# Patient Record
Sex: Male | Born: 1995 | Race: White | Hispanic: No | Marital: Single | Smoking: Never smoker
Health system: Southern US, Community
[De-identification: ages and names within clinical notes are randomized; demographics above are authoritative.]

## PROBLEM LIST (undated history)

## (undated) DIAGNOSIS — Q676 Pectus excavatum: Secondary | ICD-10-CM

## (undated) DIAGNOSIS — G43909 Migraine, unspecified, not intractable, without status migrainosus: Secondary | ICD-10-CM

## (undated) DIAGNOSIS — A4902 Methicillin resistant Staphylococcus aureus infection, unspecified site: Secondary | ICD-10-CM

## (undated) HISTORY — DX: Migraine, unspecified, not intractable, without status migrainosus: G43.909

## (undated) HISTORY — DX: Methicillin resistant Staphylococcus aureus infection, unspecified site: A49.02

## (undated) HISTORY — DX: Pectus excavatum: Q67.6

---
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
## 2020-05-27 ENCOUNTER — Emergency Department (HOSPITAL_COMMUNITY)
Admission: EM | Admit: 2020-05-27 | Discharge: 2020-05-27 | Disposition: A | Discharge status: Home / Self Care | Payer: Self-pay | Attending: Emergency Medicine | Admitting: Emergency Medicine

## 2020-05-27 ENCOUNTER — Other Ambulatory Visit: Payer: Self-pay

## 2020-05-27 DIAGNOSIS — Z8739 Personal history of other diseases of the musculoskeletal system and connective tissue: Secondary | ICD-10-CM | POA: Insufficient documentation

## 2020-05-27 DIAGNOSIS — Z5321 Procedure and treatment not carried out due to patient leaving prior to being seen by health care provider: Secondary | ICD-10-CM | POA: Insufficient documentation

## 2020-05-27 DIAGNOSIS — M25562 Pain in left knee: Secondary | ICD-10-CM | POA: Insufficient documentation

## 2020-05-27 NOTE — ED Notes (Signed)
NT from triage advised that patient has left the building.

## 2020-05-27 NOTE — ED Triage Notes (Signed)
Pt reports his L knee is "constantly popping out of place." Also has two herniated discs in his back which are causing ongoing pain down legs, in back, and into arms. Pt requesting prescription for "opiod pain medicine" because he tried contact the pain clinic and they didn't answer the phone.

## 2020-05-27 NOTE — ED Notes (Signed)
Patient reports herniated discs in back for last 5-7 years. Over the last 90 days, patient left knee cap has been popping out of place. Patient reports 10/10 pain in back and knee pain is "just annoying." Has seen chiropractor for back with no relief in pain. 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 Patient also reports that he has a CD of imaging form 11/2018 if needed.

## 2021-08-23 ENCOUNTER — Other Ambulatory Visit: Payer: Self-pay

## 2021-08-23 ENCOUNTER — Encounter (HOSPITAL_COMMUNITY): Payer: Self-pay | Admitting: Emergency Medicine

## 2021-08-23 ENCOUNTER — Emergency Department (HOSPITAL_COMMUNITY)
Admission: EM | Admit: 2021-08-23 | Discharge: 2021-08-23 | Discharge status: Against Medical Advice | Payer: Self-pay | Attending: Emergency Medicine | Admitting: Emergency Medicine

## 2021-08-23 ENCOUNTER — Emergency Department (HOSPITAL_COMMUNITY): Payer: Self-pay

## 2021-08-23 DIAGNOSIS — R200 Anesthesia of skin: Secondary | ICD-10-CM | POA: Insufficient documentation

## 2021-08-23 DIAGNOSIS — M79641 Pain in right hand: Secondary | ICD-10-CM | POA: Insufficient documentation

## 2021-08-23 DIAGNOSIS — Z5321 Procedure and treatment not carried out due to patient leaving prior to being seen by health care provider: Secondary | ICD-10-CM | POA: Insufficient documentation
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
## 2022-10-27 ENCOUNTER — Ambulatory Visit (HOSPITAL_COMMUNITY)
Admission: EM | Admit: 2022-10-27 | Discharge: 2022-10-27 | Disposition: A | Discharge status: Home / Self Care | Payer: 59

## 2022-10-27 DIAGNOSIS — F1910 Other psychoactive substance abuse, uncomplicated: Secondary | ICD-10-CM | POA: Diagnosis not present

## 2022-10-27 NOTE — ED Provider Notes (Signed)
Behavioral Health Urgent Care Medical Screening Exam  Patient Name: Zachary Johns MRN: [id number] Date of Evaluation: 10/27/22 Chief Complaint:   Diagnosis:  Final diagnoses:  Polysubstance abuse (HCC)    History of Present illness: Zachary Johns is a 27 y.o. male. Patient presents to [facility] voluntarily accompanied by his father. Patient reports that he is trying to get into a long term treatment program for his addiction. 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 He recently completed a 7 day program at [facility] in [place]. However, his dad encouraged him to get into at least a 28 day program.  He reports that he has not use any substances since discharge from [facility]. Patient reports that he has been struggling with substance use for about 10 years. His father advised that in order to prepare a long term sobriety, patient needs to get into a long term treatment program.  Patient denies SI/HI/AVH.  Reports not taking any medications. Reports no additional diagnoses.  Patient denies hx of abuse or neglect. He reports that his family is supportive. He currently lives with his grandmother and his parents  are supportive.   Assessment: 81 year old who is cooperative and pleasant upon approach. He is alert and oriented. He appears healthy an well nourished. His thought process is organized and goal-directed. Does not appear to be preoccupied or responding to internal stimuli. He has good eye contact an remains focused throughout this assessment. Patient denies SI/HI/AVH. Denies hx of self-harm behaviors. Admits to being addicted to Meth and alcohol. Last use was prior to going to [facility] 7 days ago. Patient reports that treatment at [facility] was not enough and he is looking for another service.  He reports family hx of alcohol abuse. Reports that he is supported by his family.  Patient lives with his grandmother. He does not have any children.   Patient denies medical concerns. He  denies withdrawal symptoms. Denies nausea/vomiting. Denies headache/dizziness.  Denies  tremors. Denies hx of seizures. Denies chest/back/abdominal pain. Denies respiratory problems. He reports that his goal is to get into a long term program for  long term treatment.   Patient is given resources for substance abuse treatment. He expresses motivation and states he is going to contact them. He has no sign of distress upon discharge.   Flowsheet Row ED from 10/27/2022 in [facility] ED from 08/23/2021 in [facility] Emergency Department at [facility] ED from 05/27/2020 in [facility] Department at [facility]  C-SSRS RISK CATEGORY No Risk No Risk No Risk       Psychiatric Specialty Exam  Presentation  General Appearance:Casual  Eye Contact:Fair  Speech:Clear and Coherent  Speech Volume:Normal  Handedness:Right   Mood and Affect  Mood: Euthymic  Affect: Appropriate   Thought Process  Thought Processes: Coherent  Descriptions of Associations:Intact  Orientation:Full (Time, Place and Person)  Thought Content:Logical    Hallucinations:None  Ideas of Reference:None  Suicidal Thoughts:No  Homicidal Thoughts:No   Sensorium  Memory: Immediate Fair; Recent Fair; Remote Fair  Judgment: Fair  Insight: Fair   Art therapist  Concentration: Fair  Attention Span: Fair  Recall: [employer] of Knowledge:No data recorded Language: Fair   Psychomotor Activity  Psychomotor Activity: Normal   Assets  Assets: Manufacturing systems engineer; Desire for Improvement; Social Support; Physical Health; Housing   Sleep  Sleep: Good  Number of hours: No data recorded  Physical Exam: Physical Exam Constitutional:  Appearance: Normal appearance. He is normal weight.  HENT:     Head: Normocephalic and atraumatic.     Right Ear: Tympanic membrane normal.     Left Ear: Tympanic membrane normal.  Eyes:      Extraocular Movements: Extraocular movements intact.  Cardiovascular:     Rate and Rhythm: Normal rate.     Pulses: Normal pulses.  Pulmonary:     Effort: Pulmonary effort is normal.     Breath sounds: Normal breath sounds.  Musculoskeletal:        General: Normal range of motion.     Cervical back: Normal range of motion.  Neurological:     General: No focal deficit present.     Mental Status: He is alert and oriented to person, place, and time.  Psychiatric:        Mood and Affect: Mood normal.    Review of Systems  Constitutional: Negative.   HENT: Negative.    Eyes: Negative.   Respiratory: Negative.    Cardiovascular: Negative.   Gastrointestinal: Negative.   Genitourinary: Negative.   Musculoskeletal: Negative.   Skin: Negative.   Neurological: Negative.   Endo/Heme/Allergies: Negative.   Psychiatric/Behavioral:  Positive for substance abuse.    Blood pressure (!) 143/95, pulse 97, temperature 98.6 F (37 C), temperature source Oral, resp. rate 19, SpO2 100%. There is no height or weight on file to calculate BMI.  Musculoskeletal: Strength & Muscle Tone: within normal limits Gait & Station: normal Patient leans: N/A   [facility] MSE Discharge Disposition for Follow up and Recommendations: Based on my evaluation the patient does not appear to have an emergency medical condition and can be discharged with resources and follow up care in outpatient services for Medication Management, Substance Abuse Intensive Outpatient Program, Individual Therapy, and Group Therapy   Olin Pia, NP 10/27/2022, 4:22 PM

## 2022-10-27 NOTE — Discharge Instructions (Addendum)

## 2022-10-27 NOTE — Progress Notes (Signed)
   10/27/22 1520  [facility] Triage Screening (Walk-ins at [facility] only)  How Did You Hear About [place]? Family/Friend  What Is the Reason for Your Visit/Call Today? Zachary Johns is a 27 year old male presenting to [facility] accompanied by his father. Pt was referred from [facility] and was advised to come here to request detox. Pt reports he is looking for a 30 day rehab program for his meth and alcohol addiction. Pt reports he uses a gram of meth every two weeks and also reports consuiming 6 beers a day. Pt reports he last used both substances a week ago. Pt recalls he has struggled with his addiction for roughly 10 years. Pt does not see a therapist or have any known MH disorders. Pt is also not taking any prescribed medications. Pt deneis SI, HI and AVH currently.  How Long Has This Been Causing You Problems? 1 wk - 1 month  Have You Recently Had Any Thoughts About Hurting Yourself? No  Are You Planning to Commit Suicide/Harm Yourself At This time? No  Have you Recently Had Thoughts About Hurting Someone Zachary Johns? No  Are You Planning To Harm Someone At This Time? No  Are you currently experiencing any auditory, visual or other hallucinations? No  Have You Used Any Alcohol or Drugs in the Past 24 Hours? No  Do you have any current medical co-morbidities that require immediate attention? No  What Do You Feel Would Help You the Most Today? Alcohol or Drug Use Treatment  If access to [facility] was not available, would you have sought care in the Emergency Department? No  Determination of Need Urgent (48 hours)  Options For Referral Facility-Based Crisis

## 2023-02-23 ENCOUNTER — Inpatient Hospital Stay (HOSPITAL_COMMUNITY)
Admission: AD | Admit: 2023-02-23 | Discharge: 2023-02-26 | DRG: 881 | Disposition: A | Discharge status: Home / Self Care | Payer: 59 | Source: Intra-hospital | Attending: Psychiatry | Admitting: Psychiatry

## 2023-02-23 ENCOUNTER — Encounter (HOSPITAL_COMMUNITY): Payer: Self-pay | Admitting: Behavioral Health

## 2023-02-23 ENCOUNTER — Other Ambulatory Visit: Payer: Self-pay

## 2023-02-23 DIAGNOSIS — Z716 Tobacco abuse counseling: Secondary | ICD-10-CM

## 2023-02-23 DIAGNOSIS — Z8614 Personal history of Methicillin resistant Staphylococcus aureus infection: Secondary | ICD-10-CM | POA: Diagnosis not present

## 2023-02-23 DIAGNOSIS — R4182 Altered mental status, unspecified: Principal | ICD-10-CM | POA: Insufficient documentation

## 2023-02-23 DIAGNOSIS — F1510 Other stimulant abuse, uncomplicated: Secondary | ICD-10-CM | POA: Diagnosis present

## 2023-02-23 DIAGNOSIS — F329 Major depressive disorder, single episode, unspecified: Principal | ICD-10-CM | POA: Diagnosis present

## 2023-02-23 DIAGNOSIS — Z823 Family history of stroke: Secondary | ICD-10-CM | POA: Diagnosis not present

## 2023-02-23 DIAGNOSIS — F17210 Nicotine dependence, cigarettes, uncomplicated: Secondary | ICD-10-CM | POA: Diagnosis present

## 2023-02-23 DIAGNOSIS — Z818 Family history of other mental and behavioral disorders: Secondary | ICD-10-CM

## 2023-02-23 DIAGNOSIS — Z8249 Family history of ischemic heart disease and other diseases of the circulatory system: Secondary | ICD-10-CM

## 2023-02-23 DIAGNOSIS — Q676 Pectus excavatum: Secondary | ICD-10-CM | POA: Diagnosis not present

## 2023-02-23 DIAGNOSIS — R410 Disorientation, unspecified: Secondary | ICD-10-CM | POA: Diagnosis not present

## 2023-02-23 DIAGNOSIS — E871 Hypo-osmolality and hyponatremia: Secondary | ICD-10-CM | POA: Diagnosis present

## 2023-02-23 DIAGNOSIS — G4700 Insomnia, unspecified: Secondary | ICD-10-CM | POA: Diagnosis present

## 2023-02-23 MED ORDER — MAGNESIUM HYDROXIDE 400 MG/5ML PO SUSP: 30.0000 mL | Freq: Every day | ORAL | Status: DC | PRN

## 2023-02-23 MED ORDER — HYDROXYZINE HCL 25 MG PO TABS: 25.0000 mg | ORAL_TABLET | Freq: Three times a day (TID) | ORAL | Status: DC | PRN

## 2023-02-23 MED ORDER — HALOPERIDOL 5 MG PO TABS: 5.0000 mg | ORAL_TABLET | Freq: Three times a day (TID) | ORAL | Status: DC | PRN

## 2023-02-23 MED ORDER — DIPHENHYDRAMINE HCL 25 MG PO CAPS: 50.0000 mg | ORAL_CAPSULE | Freq: Three times a day (TID) | ORAL | Status: DC | PRN

## 2023-02-23 MED ORDER — HALOPERIDOL LACTATE 5 MG/ML IJ SOLN: 5.0000 mg | Freq: Three times a day (TID) | INTRAMUSCULAR | Status: DC | PRN

## 2023-02-23 MED ORDER — LORAZEPAM 2 MG/ML IJ SOLN: 2.0000 mg | Freq: Three times a day (TID) | INTRAMUSCULAR | Status: DC | PRN

## 2023-02-23 MED ORDER — ALUM & MAG HYDROXIDE-SIMETH 200-200-20 MG/5ML PO SUSP: 30.0000 mL | ORAL | Status: DC | PRN

## 2023-02-23 MED ORDER — DIPHENHYDRAMINE HCL 50 MG/ML IJ SOLN: 50.0000 mg | Freq: Three times a day (TID) | INTRAMUSCULAR | Status: DC | PRN

## 2023-02-23 MED ORDER — HALOPERIDOL LACTATE 5 MG/ML IJ SOLN: 10.0000 mg | Freq: Three times a day (TID) | INTRAMUSCULAR | Status: DC | PRN

## 2023-02-23 MED ORDER — TRAZODONE HCL 50 MG PO TABS: 50.0000 mg | ORAL_TABLET | Freq: Every evening | ORAL | Status: DC | PRN
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
## 2023-02-23 NOTE — Tx Team (Signed)
Initial Treatment Plan 02/23/2023 12:52 PM Zachary Johns VWU:[id number]    PATIENT STRESSORS: Legal issue   Substance abuse     PATIENT STRENGTHS: Motivation for treatment/growth  Work skills    PATIENT IDENTIFIED PROBLEMS: "I need to clear my mind"  "I had been drinking a whole case every day"                   DISCHARGE CRITERIA:  Improved stabilization in mood, thinking, and/or behavior  PRELIMINARY DISCHARGE PLAN: Attend 12-step recovery group Outpatient therapy  PATIENT/FAMILY INVOLVEMENT: This treatment plan has been presented to and reviewed with the patient, Zachary Johns.  The patient has been given the opportunity to ask questions and make suggestions.  Karn Pickler, RN 02/23/2023, 12:52 PM

## 2023-02-23 NOTE — [facility] Group Notes (Signed)
[facility] Group Notes:  (Nursing/MHT/Case Management/Adjunct)  Date:  02/23/2023  Time:  8:09 PM  Type of Therapy:   AA Group  Participation Level:  Active  Participation Quality:  Appropriate  Affect:  Appropriate  Cognitive:  Appropriate  Insight:  Appropriate  Engagement in Group:  Engaged  Modes of Intervention:  Education  Summary of Progress/Problems: [name]  Zachary Johns 02/23/2023, 8:09 PM

## 2023-02-23 NOTE — Group Note (Signed)
Date:  02/23/2023 Time:  1:47 PM  Group Topic/Focus:  Goals Group:   The focus of this group is to help patients establish daily goals to achieve during treatment and discuss how the patient can incorporate goal setting into their daily lives to aide in recovery. Orientation:   The focus of this group is to educate the patient on the purpose and policies of crisis stabilization and provide a format to answer questions about their admission.  The group details unit policies and expectations of patients while admitted.    Participation Level:  Did Not Attend  Participation Quality:   n/a  Affect:   n/a  Cognitive:   n/a  Insight: None  Engagement in Group:   n/a  Modes of Intervention:   n/a  Additional Comments:   Pt did not attend.  Edmund Hilda Karole Oo 02/23/2023, 1:47 PM

## 2023-02-23 NOTE — Plan of Care (Signed)
   Problem: Education: Goal: Knowledge of [place] [facility] Education information/materials will improve Outcome: Progressing Goal: Emotional status will improve Outcome: Progressing Goal: Mental status will improve Outcome: Progressing Goal: Verbalization of understanding the information provided will improve Outcome: Progressing

## 2023-02-23 NOTE — Progress Notes (Signed)
   02/23/23 1200  Psych Admission Type (Psych Patients Only)  Admission Status Involuntary  Psychosocial Assessment  Patient Complaints Decreased concentration;Substance abuse  Eye Contact Fair  Facial Expression Animated  Affect Anxious  Speech Logical/coherent  Interaction Cautious  Motor Activity Other (Comment) (WNL)  Appearance/Hygiene In scrubs  Behavior Characteristics Cooperative  Mood Pleasant;Apprehensive  Thought Process  Coherency Concrete thinking  Content WDL  Delusions None reported or observed  Perception WDL  Hallucination None reported or observed  Judgment Limited  Confusion None  Danger to Self  Current suicidal ideation? Denies  Danger to Others  Danger to Others None reported or observed

## 2023-02-24 DIAGNOSIS — R4182 Altered mental status, unspecified: Principal | ICD-10-CM | POA: Insufficient documentation

## 2023-02-24 DIAGNOSIS — R410 Disorientation, unspecified: Secondary | ICD-10-CM | POA: Diagnosis not present

## 2023-02-24 LAB — COMPREHENSIVE METABOLIC PANEL
ALT: 22 U/L (ref 0–44)
AST: 22 U/L (ref 15–41)
Albumin: 4.3 g/dL (ref 3.5–5.0)
Alkaline Phosphatase: 56 U/L (ref 38–126)
Anion gap: 13 (ref 5–15)
BUN: 11 mg/dL (ref 6–20)
CO2: 23 mmol/L (ref 22–32)
Calcium: 9.1 mg/dL (ref 8.9–10.3)
Chloride: 100 mmol/L (ref 98–111)
Creatinine, Ser: 1 mg/dL (ref 0.61–1.24)
GFR, Estimated: >60 mL/min (ref >60)
Glucose, Bld: 78 mg/dL (ref 70–99)
Potassium: 3.7 mmol/L (ref 3.5–5.1)
Sodium: 136 mmol/L (ref 135–145)
Total Bilirubin: 0.5 mg/dL (ref 0.0–1.2)
Total Protein: 7.1 g/dL (ref 6.5–8.1)

## 2023-02-24 LAB — CBC WITH DIFFERENTIAL/PLATELET
Abs Immature Granulocytes: 0.08 10*3/uL — ABNORMAL HIGH (ref 0.00–0.07)
Basophils Absolute: 0.1 10*3/uL (ref 0.0–0.1)
Basophils Relative: 1 %
Eosinophils Absolute: 0.3 10*3/uL (ref 0.0–0.5)
Eosinophils Relative: 3 %
HCT: 41.9 % (ref 39.0–52.0)
Hemoglobin: 13.7 g/dL (ref 13.0–17.0)
Immature Granulocytes: 1 %
Lymphocytes Relative: 34 %
Lymphs Abs: 2.8 10*3/uL (ref 0.7–4.0)
MCH: 31.4 pg (ref 26.0–34.0)
MCHC: 32.7 g/dL (ref 30.0–36.0)
MCV: 96.1 fL (ref 80.0–100.0)
Monocytes Absolute: 1 10*3/uL (ref 0.1–1.0)
Monocytes Relative: 12 %
Neutro Abs: 3.9 10*3/uL (ref 1.7–7.7)
Neutrophils Relative %: 49 %
Platelets: 259 10*3/uL (ref 150–400)
RBC: 4.36 MIL/uL (ref 4.22–5.81)
RDW: 12.7 % (ref 11.5–15.5)
WBC: 8.1 10*3/uL (ref 4.0–10.5)
nRBC: 0 % (ref 0.0–0.2)

## 2023-02-24 LAB — RAPID URINE DRUG SCREEN, HOSP PERFORMED
Amphetamines: NOT DETECTED
Barbiturates: NOT DETECTED
Benzodiazepines: NOT DETECTED
Cocaine: NOT DETECTED
Opiates: NOT DETECTED
Tetrahydrocannabinol: NOT DETECTED

## 2023-02-24 LAB — TSH: TSH: 1.019 u[IU]/mL (ref 0.350–4.500)

## 2023-02-24 MED ORDER — VITAMIN B-1 100 MG PO TABS
100.0000 mg | ORAL_TABLET | Freq: Every day | ORAL | Status: DC
  Administered 2023-02-24 – 2023-02-26 (×3): 100 mg via ORAL
  Filled 2023-02-24 (×6): qty 1

## 2023-02-24 MED ORDER — ACETAMINOPHEN 325 MG PO TABS: 650.0000 mg | ORAL_TABLET | Freq: Four times a day (QID) | ORAL | Status: DC | PRN
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
## 2023-02-24 NOTE — [facility] Group Notes (Signed)
[facility] Group Notes:  (Nursing/MHT/Case Management/Adjunct)  Date:  02/24/2023  Time:  9:38 PM  Type of Therapy:   Wrap-up group  Participation Level:  Active  Participation Quality:  Appropriate  Affect:  Appropriate and Excited  Cognitive:  Appropriate  Insight:  Appropriate  Engagement in Group:  Engaged  Modes of Intervention:  Education  Summary of Progress/Problems Pt goal to enjoy the day. Met goal. Rated day 10/10.  Zachary Johns 02/24/2023, 9:38 PM

## 2023-02-24 NOTE — Plan of Care (Signed)
   Problem: Education: Goal: Knowledge of [facility] Education information/materials will improve Outcome: Progressing Goal: Emotional status will improve Outcome: Progressing Goal: Mental status will improve Outcome: Progressing Goal: Verbalization of understanding the information provided will improve Outcome: Progressing   Problem: Activity: Goal: Interest or engagement in activities will improve Outcome: Progressing Goal: Sleeping patterns will improve Outcome: Progressing
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
## 2023-02-24 NOTE — Progress Notes (Signed)
   02/24/23 2100  Psych Admission Type (Psych Patients Only)  Admission Status Involuntary  Psychosocial Assessment  Patient Complaints Substance abuse  Eye Contact Fair  Facial Expression Animated  Affect Appropriate to circumstance  Speech Logical/coherent  Interaction Assertive  Motor Activity Slow  Appearance/Hygiene In scrubs  Behavior Characteristics Cooperative;Appropriate to situation  Mood Pleasant  Thought Process  Coherency WDL  Content WDL  Delusions None reported or observed  Perception WDL  Hallucination None reported or observed  Judgment Poor  Confusion None  Danger to Self  Current suicidal ideation? Denies  Danger to Others  Danger to Others None reported or observed

## 2023-02-24 NOTE — Group Note (Signed)
Date:  02/24/2023 Time:  9:54 AM  Group Topic/Focus:  Goals Group:   The focus of this group is to help patients establish daily goals to achieve during treatment and discuss how the patient can incorporate goal setting into their daily lives to aide in recovery. Orientation:   The focus of this group is to educate the patient on the purpose and policies of crisis stabilization and provide a format to answer questions about their admission.  The group details unit policies and expectations of patients while admitted.    Participation Level:  Did Not Attend  Deforest Hoyles [facility] 02/24/2023, 9:54 AM

## 2023-02-24 NOTE — Plan of Care (Signed)
   Problem: Education: Goal: Knowledge of [facility] Education information/materials will improve Outcome: Progressing Goal: Emotional status will improve Outcome: Progressing

## 2023-02-24 NOTE — Progress Notes (Signed)
EKG results placed on the outside of pt's shadow chart  Normal sinus rhythm Normal ECG  QT/Qtc-Baz  390/402 ms

## 2023-02-24 NOTE — [facility] Group Notes (Signed)
[facility] Group Notes:  (Nursing)  Date:  02/24/2023  Time:  1400  Type of Therapy:  Psychoeducational Skills  Participation Level:  Active  Participation Quality:  Appropriate and Attentive  Affect:  Appropriate  Cognitive:  Alert and Appropriate  Insight:  Appropriate and Good  Engagement in Group:  Engaged and Supportive  Modes of Intervention:  Activity, Discussion, Exploration, Rapport Building, Socialization, and Support  Summary of Progress/Problems:  Zachary Johns 02/24/2023, 4:21 PM

## 2023-02-24 NOTE — Plan of Care (Signed)
   Problem: Education: Goal: Knowledge of [facility] Education information/materials will improve Outcome: Progressing   Problem: Activity: Goal: Interest or engagement in activities will improve Outcome: Progressing   Problem: Coping: Goal: Ability to verbalize frustrations and anger appropriately will improve Outcome: Progressing

## 2023-02-24 NOTE — Progress Notes (Signed)

## 2023-02-24 NOTE — Progress Notes (Signed)
   02/24/23 1000  Psych Admission Type (Psych Patients Only)  Admission Status Involuntary  Psychosocial Assessment  Patient Complaints Substance abuse  Eye Contact Fair  Facial Expression Animated  Affect Anxious  Speech Logical/coherent  Interaction Cautious  Motor Activity Other (Comment) (steady gait)  Appearance/Hygiene In scrubs  Behavior Characteristics Cooperative  Mood Pleasant;Anxious  Thought Process  Coherency Concrete thinking  Content WDL  Delusions None reported or observed  Perception WDL  Hallucination None reported or observed  Judgment Limited  Confusion None  Danger to Self  Current suicidal ideation? Denies  Danger to Others  Danger to Others None reported or observed

## 2023-02-24 NOTE — Group Note (Signed)
Date:  02/24/2023 Time:  1:11 PM  Group Topic/Focus:  Emotional Education:   The focus of this group is to discuss what feelings/emotions are, and how they are experienced.    Participation Level:  Active  Zachary Johns 02/24/2023, 1:11 PM

## 2023-02-25 DIAGNOSIS — R410 Disorientation, unspecified: Secondary | ICD-10-CM | POA: Diagnosis not present
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
## 2023-02-25 NOTE — Group Note (Signed)
Date:  02/25/2023 Time:  8:51 PM  Group Topic/Focus:  Wrap-Up Group:   The focus of this group is to help patients review their daily goal of treatment and discuss progress on daily workbooks.    Participation Level:  Active  Participation Quality:  Appropriate and Sharing  Affect:  Appropriate  Cognitive:  Appropriate  Insight: Appropriate  Engagement in Group:  Engaged  Modes of Intervention:  Activity and Socialization  Additional Comments:  Patient shared he had a "good day" and that he has been here for three day. Patient shared since being here it has been great. Patient shared his goal for today "spend time with other and learn from others while here". Patient rated his day a 10/10. Patient participated in activity after sharing.   Kennieth Francois 02/25/2023, 8:51 PM

## 2023-02-25 NOTE — Progress Notes (Signed)
   02/24/23 2100  Charting Type  Charting Type Shift assessment  Safety Check Verification  Has the RN verified the 15 minute safety check completion? Yes  Neurological  Neuro (WDL) WDL  HEENT  HEENT (WDL) WDL  Respiratory  Respiratory (WDL) WDL  Cardiac  Cardiac (WDL) WDL  Vascular  Vascular (WDL) WDL  Integumentary  Integumentary (WDL) X  Braden Scale (Ages 8 and up)  Sensory Perceptions 4  Moisture 4  Activity 4  Mobility 4  Nutrition 3  Friction and Shear 3  Braden Scale Score 22  Musculoskeletal  Musculoskeletal (WDL) WDL  Assistive Device None  Gastrointestinal  Gastrointestinal (WDL) WDL  GU Assessment  Genitourinary (WDL) WDL  Neurological  Level of Consciousness Alert

## 2023-02-25 NOTE — [facility] Group Notes (Signed)
[facility] Group Notes:  (Nursing/MHT/Case Management/Adjunct)  Date:  02/25/2023  Time:  8:57 AM  Type of Therapy:   Goals  Participation Level:  Active  Participation Quality:  Attentive  Affect:  Appropriate  Cognitive:  Appropriate  Insight:  Appropriate  Engagement in Group:  Engaged and Improving  Modes of Intervention:  Orientation and Socialization  Summary of Progress/Problems:  Zachary Johns 02/25/2023, 8:57 AM

## 2023-02-25 NOTE — Progress Notes (Signed)
   02/25/23 1200  Charting Type  Charting Type Shift Assessment  Neurological  Neuro (WDL) WDL  HEENT  HEENT (WDL) WDL  Respiratory  Respiratory (WDL) WDL  Cardiac  Cardiac (WDL) WDL  Vascular  Vascular (WDL) WDL  Musculoskeletal  Musculoskeletal (WDL) WDL  Assistive Device None  Gastrointestinal  Gastrointestinal (WDL) WDL  GU Assessment  Genitourinary (WDL) WDL  Integumentary  Integumentary (WDL) X  Braden Scale (Ages 8 and up)  Sensory Perceptions 4  Moisture 4  Activity 4  Mobility 4  Nutrition 3  Friction and Shear 3  Braden Scale Score 22  Neurological  Level of Consciousness Alert

## 2023-02-25 NOTE — Progress Notes (Signed)
   02/25/23 1600  Psych Admission Type (Psych Patients Only)  Admission Status Voluntary  Psychosocial Assessment  Patient Complaints Anxiety  Eye Contact Fair  Facial Expression Animated  Affect Appropriate to circumstance  Speech Logical/coherent  Interaction Assertive  Motor Activity Slow  Appearance/Hygiene In scrubs  Behavior Characteristics Cooperative;Appropriate to situation  Mood Pleasant  Thought Process  Coherency WDL  Content WDL  Delusions None reported or observed  Perception WDL  Hallucination None reported or observed  Judgment Poor  Confusion None  Danger to Self  Current suicidal ideation? Denies  Danger to Others  Danger to Others None reported or observed

## 2023-02-25 NOTE — Progress Notes (Signed)
   02/25/23 2050  Psych Admission Type (Psych Patients Only)  Admission Status Voluntary  Psychosocial Assessment  Patient Complaints Isolation  Eye Contact Fair  Facial Expression Animated  Affect Appropriate to circumstance  Speech Logical/coherent  Interaction Assertive  Motor Activity Slow  Appearance/Hygiene In scrubs  Behavior Characteristics Cooperative;Appropriate to situation  Mood Pleasant  Thought Process  Coherency WDL  Content WDL  Delusions None reported or observed  Perception WDL  Hallucination None reported or observed  Judgment Poor  Confusion None  Danger to Self  Current suicidal ideation? Denies  Danger to Others  Danger to Others None reported or observed

## 2023-02-25 NOTE — Group Note (Signed)
[facility] LCSW Group Therapy Note    Group Date: 02/25/2023 Start Time: 1000 End Time: 1120  Type of Therapy and Topic:  Group Therapy:  Change/ Overcoming Obstacles  Participation Level:  [facility] PARTICIPATION LEVEL: Did Not Attend  Mood:  Description of Group:   In this group patients will be encouraged to explore what they see as obstacles to their own wellness and recovery. They will be guided to discuss their thoughts, feelings, and behaviors related to these obstacles. The group will process together ways to cope with barriers, with attention given to specific choices patients can make. Each patient will be challenged to identify changes they are motivated to make in order to overcome their obstacles. This group will be process-oriented, with patients participating in exploration of their own experiences as well as giving and receiving support and challenge from other group members.  Therapeutic Goals: 1. Patient will identify personal and current obstacles as they relate to admission. 2. Patient will identify barriers that currently interfere with their wellness or overcoming obstacles.  3. Patient will identify feelings, thought process and behaviors related to these barriers. 4. Patient will identify two changes they are willing to make to overcome these obstacles:    Summary of Patient Progress  Pt was invited to group, did not attend   Therapeutic Modalities:   Cognitive Behavioral Therapy Solution Focused Therapy Motivational Interviewing Relapse Prevention Therapy   Steffanie Dunn, LCSWA

## 2023-02-25 NOTE — [facility] Group Notes (Signed)
[facility] Group Notes:  (Nursing)  Date:  02/25/2023  Time:  1400  Type of Therapy:  Music Therapy  Participation Level:  Active  Participation Quality:  Appropriate and Attentive  Affect:  Appropriate  Cognitive:  Alert and Appropriate  Insight:  Appropriate  Engagement in Group:  Engaged  Modes of Intervention:  Activity, Exploration, Rapport Building, Socialization, and Support  Summary of Progress/Problems:  Zachary Johns 02/25/2023, 4:55 PM
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
## 2023-02-26 DIAGNOSIS — R410 Disorientation, unspecified: Secondary | ICD-10-CM

## 2023-02-26 MED ORDER — VITAMIN B-1 100 MG PO TABS: 100.0000 mg | ORAL_TABLET | Freq: Every day | ORAL | Status: AC

## 2023-02-26 NOTE — Progress Notes (Signed)
  [facility] Adult Case Management Discharge Plan :  Will you be returning to the same living situation after discharge:  Yes,  with father At discharge, do you have transportation home?: Yes,  father will transport Do you have the ability to pay for your medications: Yes,  has [employer] of information consent forms completed and in the chart;  Patient's signature needed at discharge.  Patient to Follow up at:  Follow-up Information     [facility] Call on 02/28/2023.   Why: Please call to schedule an appointment with this provider for therapy and medication management services, as we were unable to do so prior to your discharge. Contact information: [address] [place] [address] [phone]                 Next level of care provider has access to [facility] Link:no  Safety Planning and Suicide Prevention discussed: Yes,  with Father Ianmichael Amescua [phone]     Has patient been referred to the Quitline?: Patient refused referral for treatment  Patient has been referred for addiction treatment: Patient refused referral for treatment.  Marinda Elk, LCSW 02/26/2023, 9:22 AM

## 2023-02-26 NOTE — Plan of Care (Signed)
 Nurse discussed coping skills with patient.

## 2023-02-26 NOTE — [facility] Suicide Risk Assessment (Signed)
[facility] INPATIENT:  Family/Significant Other Suicide Prevention Education  Suicide Prevention Education:  Education Completed; Angeles Paolucci, [phone] (father) has been identified by the patient as the family member/significant other with whom the patient will be residing, and identified as the person(s) who will aid the patient in the event of a mental health crisis (suicidal ideations/suicide attempt).  With written consent from the patient, the family member/significant other has been provided the following suicide prevention education, prior to the and/or following the discharge of the patient.  The suicide prevention education provided includes the following: Suicide risk factors Suicide prevention and interventions National Suicide Hotline telephone number [facility] assessment telephone number [facility] Emergency Assistance 911 [facility] and/or Residential Mobile Crisis Unit telephone number  Request made of family/significant other to: Remove weapons (e.g., guns, rifles, knives), all items previously/currently identified as safety concern.   Remove drugs/medications (over-the-counter, prescriptions, illicit drugs), all items previously/currently identified as a safety concern.  The family member/significant other verbalizes understanding of the suicide prevention education information provided.  The family member/significant other agrees to remove the items of safety concern listed above.  Jodelle Gross Marvel Mcphillips 02/26/2023, 9:21 AM

## 2023-02-26 NOTE — Group Note (Signed)
Recreation Therapy Group Note   Group Topic:Stress Management  Group Date: 02/26/2023 Start Time: 0935 End Time: 1000 Facilitators: Kimmarie Pascale-McCall, LRT,CTRS Location: [address] Dayroom   Group Topic: Stress Management  Goal Area(s) Addresses:  Patient will identify positive stress management techniques. Patient will identify benefits of using stress management post d/c.  Intervention: Insight Timer App  Activity : Meditation. LRT engaged with patients about meditation and the benefits of it. LRT then played a meditation that focused on getting prepared for the day and being able to speak positive affirmations to themselves when negative thoughts start to creep in. Patients were encouraged to relax, get in a comfortable position, focus on their breathing and be attentive to the meditation.   Education:  Stress Management, Discharge Planning.   Education Outcome: Acknowledges Education   Affect/Mood: Appropriate   Participation Level: Active   Participation Quality: Independent   Behavior: Attentive    Speech/Thought Process: Focused   Insight: Good   Judgement: Good   Modes of Intervention: App   Patient Response to Interventions:  Attentive   Education Outcome:  In group clarification offered    Clinical Observations/Individualized Feedback: Pt attended and participated in group. Pt was attentive and focused during group session.      Plan: Continue to engage patient in RT group sessions 2-3x/week.   Yancarlos Berthold-McCall, LRT,CTRS  02/26/2023 11:44 AM

## 2023-02-26 NOTE — Plan of Care (Signed)
 Nurse discussed anxiety with patient.

## 2023-02-26 NOTE — Discharge Instructions (Signed)
-  Follow-up with outpatient primary care doctor and other specialists -for management of preventative medicine and any chronic medical disease.  -Recommend abstinence from alcohol, tobacco, and other illicit drug use at discharge.   -If your psychiatric symptoms recur, worsen, or if you have side effects to your psychiatric medications, call your outpatient psychiatric provider, 911, [phone] or go to the nearest emergency department.  -If suicidal thoughts occur, call your outpatient psychiatric provider, 911, [phone] or go to the nearest emergency department.  Naloxone (Narcan) can help reverse an overdose when given to the victim quickly.  [facility] offers free naloxone kits and instructions/training on its use.  Add naloxone to your first aid kit and you can help save a life.   Pick up your free kit at the following locations:   [place]:  [facility] Division of [facility], [address] [place] [address] [phone]) [facility] [address] [place] [address] [phone]) [facility] [address] [place] [address]  [place]: [facility] Division of [facility] [address] [place] [address] ([phone]) [facility] [address] [place] [address] [phone])

## 2023-02-26 NOTE — Progress Notes (Signed)
Discharge Note:  Patient discharged home with father.  Suicide prevention information given and discussed with patient.  Patient denied SI and HI, denied A/V hallucinations.  Patient stated he received all his belongings, clothing, toiletries, misc items, etc.  All required discharge information given.

## 2023-02-26 NOTE — Plan of Care (Signed)
   Problem: Education: Goal: Knowledge of [facility] Education information/materials will improve Outcome: Progressing   Problem: Activity: Goal: Interest or engagement in activities will improve Outcome: Progressing   Problem: Coping: Goal: Ability to verbalize frustrations and anger appropriately will improve Outcome: Progressing

## 2023-02-26 NOTE — [facility] Suicide Risk Assessment (Signed)
[facility] Discharge Suicide Risk Assessment   Principal Problem: Altered mental status, unspecified Discharge Diagnoses: Principal Problem:   Altered mental status, unspecified Active Problems:   MDD (major depressive disorder)   Total Time spent with patient: 15 minutes  Patient is a 28 year old male who denies having a previous psychiatric history, who was admitted from [facility] to the psychiatric hospital for altered mental status.   During the patient's hospitalization, patient had extensive initial psychiatric evaluation, and follow-up psychiatric evaluations every day.   Psychiatric diagnoses provided upon initial assessment:  Altered Mental Status    Patient's psychiatric medications were adjusted on admission: started thiamine   During the hospitalization, other adjustments were made to the patient's psychiatric medication regimen: none   Patient's care was discussed during the interdisciplinary team meeting every day during the hospitalization.  Patient was asked each day to complete a self inventory noting mood, mental status, pain, new symptoms, anxiety and concerns.     On day of discharge, the patient reports that their mood is stable. The patient denied having suicidal thoughts for more than 48 hours prior to discharge.  Patient denies having homicidal thoughts.  Patient denies having auditory hallucinations.  Patient denies any visual hallucinations or other symptoms of psychosis. The patient was motivated to continue taking medication with a goal of continued improvement in mental health.    The patient reports their target psychiatric symptoms of confusion resolved.    Labs were reviewed with the patient, and abnormal results were discussed with the patient.   The patient is able to verbalize their individual safety plan to this provider.   # It is recommended to the patient to follow up with your outpatient psychiatric provider and PCP.   # It was discussed  with the patient, the impact of alcohol, drugs, tobacco have been there overall psychiatric and medical wellbeing, and total abstinence from substance use was recommended the patient.ed.   # In the event of worsening symptoms, the patient is instructed to call the crisis hotline, 911 and or go to the nearest ED for appropriate evaluation and treatment of symptoms. To follow-up with primary care provider for other medical issues, concerns and or health care needs   # Patient was discharged to home with father and grand parents, with a plan to follow up as noted below.   Psychiatric Specialty Exam  Presentation  General Appearance:  Appropriate for Environment; Casual; Fairly Groomed  Eye Contact: Good  Speech: Normal Rate; Clear and Coherent  Speech Volume: Normal  Handedness: Right   Mood and Affect  Mood: Euthymic  Duration of Depression Symptoms: No data recorded Affect: Appropriate; Congruent; Full Range   Thought Process  Thought Processes: Linear  Descriptions of Associations:Intact  Orientation:Full (Time, Place and Person)  Thought Content:Logical  History of Schizophrenia/Schizoaffective disorder:No data recorded Duration of Psychotic Symptoms:No data recorded Hallucinations:Hallucinations: None  Ideas of Reference:None  Suicidal Thoughts:Suicidal Thoughts: No  Homicidal Thoughts:Homicidal Thoughts: No   Sensorium  Memory: Immediate Good; Recent Good; Remote Good  Judgment: Good  Insight: Good   Executive Functions  Concentration: Good  Attention Span: Good  Recall: Good  Fund of Knowledge: Good  Language: Good   Psychomotor Activity  Psychomotor Activity: Psychomotor Activity: Normal   Assets  Assets: Communication Skills; Desire for Improvement; Social Support; Physical Health; Housing   Sleep  Sleep: Sleep: Fair   Physical Exam: Physical Exam See discharge summary  ROS See discharge summary   Blood  pressure 111/85, pulse 75,  temperature 98.3 F (36.8 C), temperature source Oral, resp. rate 16, height 5\' 11"  (1.803 m), weight 72.8 kg, SpO2 100%. Body mass index is 22.4 kg/m.  Mental Status Per Nursing Assessment::   On Admission:  NA  Demographic factors:  Male, Low socioeconomic status Loss Factors:  NA Historical Factors:  Family history of mental illness or substance abuse Risk Reduction Factors:  Employed, Positive coping skills or problem solving skills  Continued Clinical Symptoms:  Mood is stable. Anxiety at a manageable level. Denying any SI including passive SI.    Cognitive Features That Contribute To Risk:  None    Suicide Risk:  Minimal: No identifiable suicidal ideation.  Patients presenting with no risk factors but with morbid ruminations; may be classified as minimal risk based on the severity of the depressive symptoms    Follow-up Information     [facility] Call on 02/28/2023.   Why: Please call to schedule an appointment with this provider for therapy and medication management services, as we were unable to do so prior to your discharge. Contact information: [address] [place] [address] [phone]                 Plan Of Care/Follow-up recommendations:   -Follow-up with your outpatient psychiatric provider -instructions on appointment date, time, and address (location) are provided to you in discharge paperwork.   -Follow-up with outpatient primary care doctor and other specialists -for management of preventative medicine and chronic medical disease   -Recommend total abstinence from alcohol, tobacco, and other illicit drug use at discharge.    -If your psychiatric symptoms recur, worsen, or if you have side effects to your psychiatric medications, call your outpatient psychiatric provider, 911, 988 or go to the nearest emergency department.   -If suicidal thoughts occur, immediately call your outpatient  psychiatric provider, 911, 988 or go to the nearest emergency department.   Phineas Inches, MD 02/26/2023, 9:42 AM
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
